# Patient Record
Sex: Female | Born: 1970 | Race: White | Hispanic: No | Marital: Married | Smoking: Never smoker
Health system: Southern US, Community
[De-identification: ages and names within clinical notes are randomized; demographics above are authoritative.]

## PROBLEM LIST (undated history)

## (undated) DIAGNOSIS — D649 Anemia, unspecified: Secondary | ICD-10-CM

## (undated) DIAGNOSIS — M1990 Unspecified osteoarthritis, unspecified site: Secondary | ICD-10-CM

## (undated) HISTORY — PX: TONSILLECTOMY AND ADENOIDECTOMY: SHX28

## (undated) HISTORY — PX: ABDOMINAL HYSTERECTOMY: SHX81

---

## 2007-09-29 ENCOUNTER — Encounter: Payer: Self-pay | Admitting: Obstetrics & Gynecology

## 2007-11-10 ENCOUNTER — Encounter: Payer: Self-pay | Admitting: Maternal & Fetal Medicine

## 2008-01-16 ENCOUNTER — Encounter: Payer: Self-pay | Admitting: Maternal & Fetal Medicine

## 2008-03-30 ENCOUNTER — Inpatient Hospital Stay: Payer: Self-pay | Admitting: Obstetrics and Gynecology

## 2009-06-26 ENCOUNTER — Inpatient Hospital Stay: Payer: Self-pay

## 2010-03-30 HISTORY — PX: DILATION AND CURETTAGE OF UTERUS: SHX78

## 2010-05-13 ENCOUNTER — Ambulatory Visit: Payer: Self-pay | Admitting: Obstetrics and Gynecology

## 2010-05-14 ENCOUNTER — Ambulatory Visit: Payer: Self-pay | Admitting: Obstetrics and Gynecology

## 2010-05-19 LAB — PATHOLOGY REPORT

## 2016-05-25 ENCOUNTER — Other Ambulatory Visit: Payer: Self-pay | Admitting: Family Medicine

## 2016-05-25 DIAGNOSIS — Z1231 Encounter for screening mammogram for malignant neoplasm of breast: Secondary | ICD-10-CM

## 2016-06-19 ENCOUNTER — Ambulatory Visit
Admission: RE | Admit: 2016-06-19 | Discharge: 2016-06-19 | Disposition: A | Discharge status: Home / Self Care | Payer: Managed Care, Other (non HMO) | Source: Ambulatory Visit | Attending: Family Medicine | Admitting: Family Medicine

## 2016-06-19 DIAGNOSIS — Z1231 Encounter for screening mammogram for malignant neoplasm of breast: Secondary | ICD-10-CM

## 2017-02-15 ENCOUNTER — Other Ambulatory Visit: Payer: Self-pay

## 2017-02-15 ENCOUNTER — Encounter
Admission: RE | Admit: 2017-02-15 | Discharge: 2017-02-15 | Disposition: A | Discharge status: Home / Self Care | Payer: 59 | Source: Ambulatory Visit | Attending: Surgery | Admitting: Surgery

## 2017-02-15 HISTORY — DX: Anemia, unspecified: D64.9

## 2017-02-15 HISTORY — DX: Unspecified osteoarthritis, unspecified site: M19.90

## 2017-02-15 NOTE — Patient Instructions (Signed)
Your procedure is scheduled on: 02-26-17 Report to Same Day Surgery 2nd floor medical mall [facility](Medical Mall Entrance-take elevator on left to 2nd floor.  Check in with surgery information desk.) To find out your arrival time please call [phone] between 1PM - 3PM on 02-25-17  Remember: Instructions that are not followed completely may result in serious medical risk, up to and including death, or upon the discretion of your surgeon and anesthesiologist your surgery may need to be rescheduled.    _x___ 1. Do not eat food after midnight the night before your procedure. You may drink clear liquids up to 2 hours before you are scheduled to arrive at the hospital for your procedure.  Do not drink clear liquids within 2 hours of your scheduled arrival to the hospital.  Clear liquids include  --Water or Apple juice without pulp  --Clear carbohydrate beverage such as ClearFast or Gatorade  --Black Coffee or Clear Tea (No milk, no creamers, do not add anything to the coffee or Tea Type 1 and type 2 diabetics should only drink water.  No gum chewing or hard candies.     __x__ 2. No Alcohol for 24 hours before or after surgery.   __x__3. No Smoking for 24 prior to surgery.   ____  4. Bring all medications with you on the day of surgery if instructed.    __x__ 5. Notify your doctor if there is any change in your medical condition     (cold, fever, infections).     Do not wear jewelry, make-up, hairpins, clips or nail polish.  Do not wear lotions, powders, or perfumes. You may wear deodorant.  Do not shave 48 hours prior to surgery. Men may shave face and neck.  Do not bring valuables to the hospital.    [facility] is not responsible for any belongings or valuables.               Contacts, dentures or bridgework may not be worn into surgery.  Leave your suitcase in the car. After surgery it may be brought to your room.  For patients admitted to the hospital, discharge time is determined by  your                       treatment team.   Patients discharged the day of surgery will not be allowed to drive home.  You will need someone to drive you home and stay with you the night of your procedure.     ____ Take anti-hypertensive listed below, cardiac, seizure, asthma, anti-reflux and psychiatric medicines. These include:  1. NONE  2.  3.  4.  5.  6.  ____Fleets enema or Magnesium Citrate as directed.   ____ Use CHG Soap or sage wipes as directed on instruction sheet   ____ Use inhalers on the day of surgery and bring to hospital day of surgery  ____ Stop Metformin and Janumet 2 days prior to surgery.    ____ Take 1/2 of usual insulin dose the night before surgery and none on the morning     surgery.   ____ Follow recommendations from Cardiologist, Pulmonologist or PCP regarding stopping Aspirin, Coumadin, Plavix ,Eliquis, Effient, or Pradaxa, and Pletal.  ___Stop Anti-inflammatories such as Advil, Aleve, Ibuprofen, Motrin, Naproxen, Naprosyn, Goodies powders or aspirin products-STOP 48 HOURS PRIOR TO SURGERY-OK to take Tylenol    ____ Stop supplements until after surgery.     ____ Bring C-Pap to the hospital.

## 2017-02-26 ENCOUNTER — Ambulatory Visit: Payer: 59 | Admitting: Anesthesiology

## 2017-02-26 ENCOUNTER — Other Ambulatory Visit: Payer: Self-pay

## 2017-02-26 ENCOUNTER — Encounter
Admission: RE | Disposition: A | Discharge status: Home / Self Care | Payer: Self-pay | Source: Ambulatory Visit | Attending: Surgery

## 2017-02-26 ENCOUNTER — Encounter: Payer: Self-pay | Admitting: *Deleted

## 2017-02-26 ENCOUNTER — Ambulatory Visit
Admission: RE | Admit: 2017-02-26 | Discharge: 2017-02-26 | Disposition: A | Discharge status: Home / Self Care | Payer: 59 | Source: Ambulatory Visit | Attending: Surgery | Admitting: Surgery

## 2017-02-26 DIAGNOSIS — M1990 Unspecified osteoarthritis, unspecified site: Secondary | ICD-10-CM | POA: Insufficient documentation

## 2017-02-26 DIAGNOSIS — D649 Anemia, unspecified: Secondary | ICD-10-CM | POA: Insufficient documentation

## 2017-02-26 DIAGNOSIS — K429 Umbilical hernia without obstruction or gangrene: Secondary | ICD-10-CM | POA: Diagnosis present

## 2017-02-26 HISTORY — PX: UMBILICAL HERNIA REPAIR: SHX196

## 2017-02-26 SURGERY — REPAIR, HERNIA, UMBILICAL, ADULT
Anesthesia: General | Site: Abdomen | Wound class: Clean

## 2017-02-26 MED ORDER — MIDAZOLAM HCL 2 MG/2ML IJ SOLN
INTRAMUSCULAR | Status: DC | PRN
  Administered 2017-02-26: 2 mg via INTRAVENOUS

## 2017-02-26 MED ORDER — FENTANYL CITRATE (PF) 100 MCG/2ML IJ SOLN: 25.0000 ug | INTRAMUSCULAR | Status: DC | PRN

## 2017-02-26 MED ORDER — KETOROLAC TROMETHAMINE 30 MG/ML IJ SOLN
INTRAMUSCULAR | Status: DC | PRN
  Administered 2017-02-26: 30 mg via INTRAVENOUS

## 2017-02-26 MED ORDER — LIDOCAINE HCL (CARDIAC) 20 MG/ML IV SOLN
INTRAVENOUS | Status: DC | PRN
  Administered 2017-02-26: 50 mg via INTRAVENOUS

## 2017-02-26 MED ORDER — ONDANSETRON HCL 4 MG/2ML IJ SOLN
INTRAMUSCULAR | Status: AC
  Filled 2017-02-26: qty 2

## 2017-02-26 MED ORDER — GLYCOPYRROLATE 0.2 MG/ML IJ SOLN
INTRAMUSCULAR | Status: DC | PRN
  Administered 2017-02-26: 0.2 mg via INTRAVENOUS

## 2017-02-26 MED ORDER — ONDANSETRON HCL 4 MG/2ML IJ SOLN
INTRAMUSCULAR | Status: DC | PRN
  Administered 2017-02-26: 4 mg via INTRAVENOUS

## 2017-02-26 MED ORDER — PROPOFOL 10 MG/ML IV BOLUS
INTRAVENOUS | Status: DC | PRN
  Administered 2017-02-26: 100 mg via INTRAVENOUS

## 2017-02-26 MED ORDER — KETOROLAC TROMETHAMINE 30 MG/ML IJ SOLN
INTRAMUSCULAR | Status: AC
  Filled 2017-02-26: qty 1

## 2017-02-26 MED ORDER — DEXAMETHASONE SODIUM PHOSPHATE 10 MG/ML IJ SOLN
INTRAMUSCULAR | Status: AC
  Filled 2017-02-26: qty 1

## 2017-02-26 MED ORDER — BUPIVACAINE-EPINEPHRINE (PF) 0.5% -1:200000 IJ SOLN
INTRAMUSCULAR | Status: AC
  Filled 2017-02-26: qty 30

## 2017-02-26 MED ORDER — PROPOFOL 10 MG/ML IV BOLUS
INTRAVENOUS | Status: AC
  Filled 2017-02-26: qty 20

## 2017-02-26 MED ORDER — FENTANYL CITRATE (PF) 250 MCG/5ML IJ SOLN
INTRAMUSCULAR | Status: AC
  Filled 2017-02-26: qty 5

## 2017-02-26 MED ORDER — MIDAZOLAM HCL 2 MG/2ML IJ SOLN
INTRAMUSCULAR | Status: AC
  Filled 2017-02-26: qty 2

## 2017-02-26 MED ORDER — LIDOCAINE HCL (PF) 2 % IJ SOLN
INTRAMUSCULAR | Status: AC
  Filled 2017-02-26: qty 10

## 2017-02-26 MED ORDER — ONDANSETRON HCL 4 MG/2ML IJ SOLN: 4.0000 mg | Freq: Once | INTRAMUSCULAR | Status: DC | PRN

## 2017-02-26 MED ORDER — FAMOTIDINE 20 MG PO TABS: 20.0000 mg | ORAL_TABLET | Freq: Once | ORAL | Status: DC

## 2017-02-26 MED ORDER — FAMOTIDINE 20 MG PO TABS
ORAL_TABLET | ORAL | Status: AC
  Administered 2017-02-26: 20 mg
  Filled 2017-02-26: qty 1

## 2017-02-26 MED ORDER — FENTANYL CITRATE (PF) 100 MCG/2ML IJ SOLN
INTRAMUSCULAR | Status: DC | PRN
  Administered 2017-02-26: 50 ug via INTRAVENOUS
  Administered 2017-02-26 (×3): 25 ug via INTRAVENOUS

## 2017-02-26 MED ORDER — LACTATED RINGERS IV SOLN
INTRAVENOUS | Status: DC
  Administered 2017-02-26: 12:00:00 via INTRAVENOUS

## 2017-02-26 MED ORDER — DEXAMETHASONE SODIUM PHOSPHATE 10 MG/ML IJ SOLN
INTRAMUSCULAR | Status: DC | PRN
  Administered 2017-02-26: 10 mg via INTRAVENOUS

## 2017-02-26 MED ORDER — BUPIVACAINE-EPINEPHRINE (PF) 0.5% -1:200000 IJ SOLN
INTRAMUSCULAR | Status: DC | PRN
  Administered 2017-02-26: 5 mL

## 2017-02-26 SURGICAL SUPPLY — 23 items
BLADE SURG 15 STRL LF DISP TIS (BLADE) ×1 IMPLANT
BLADE SURG 15 STRL SS (BLADE) ×2
CANISTER SUCT 1200ML W/VALVE (MISCELLANEOUS) ×3 IMPLANT
CHLORAPREP W/TINT 26ML (MISCELLANEOUS) ×3 IMPLANT
DERMABOND ADVANCED (GAUZE/BANDAGES/DRESSINGS) ×2
DERMABOND ADVANCED .7 DNX12 (GAUZE/BANDAGES/DRESSINGS) ×1 IMPLANT
DRAPE LAPAROTOMY 77X122 PED (DRAPES) ×3 IMPLANT
ELECT REM PT RETURN 9FT ADLT (ELECTROSURGICAL) ×3
ELECTRODE REM PT RTRN 9FT ADLT (ELECTROSURGICAL) ×1 IMPLANT
GLOVE BIO SURGEON STRL SZ7.5 (GLOVE) ×3 IMPLANT
GOWN STRL REUS W/ TWL LRG LVL3 (GOWN DISPOSABLE) ×2 IMPLANT
GOWN STRL REUS W/TWL LRG LVL3 (GOWN DISPOSABLE) ×4
KIT RM TURNOVER STRD PROC AR (KITS) ×3 IMPLANT
LABEL OR SOLS (LABEL) IMPLANT
NEEDLE HYPO 25X1 1.5 SAFETY (NEEDLE) ×3 IMPLANT
NS IRRIG 500ML POUR BTL (IV SOLUTION) ×3 IMPLANT
PACK BASIN MINOR ARMC (MISCELLANEOUS) ×3 IMPLANT
SUT CHROMIC 3 0 SH 27 (SUTURE) ×3 IMPLANT
SUT CHROMIC 4 0 RB 1X27 (SUTURE) ×3 IMPLANT
SUT MNCRL+ 5-0 UNDYED PC-3 (SUTURE) ×1 IMPLANT
SUT MONOCRYL 5-0 (SUTURE) ×2
SUT SURGILON 0 30 BLK (SUTURE) ×6 IMPLANT
SYR 10ML LL (SYRINGE) ×3 IMPLANT

## 2017-02-26 NOTE — Discharge Instructions (Signed)
Take Tylenol as needed for pain.  May also take oxycodone if needed.  May shower.  Avoid straining and heavy lifting.  Resume methotrexate and Enbrel at appropriate time as discussed.   AMBULATORY SURGERY  DISCHARGE INSTRUCTIONS   1) The drugs that you were given will stay in your system until tomorrow so for the next 24 hours you should not:  A) Drive an automobile B) Make any legal decisions C) Drink any alcoholic beverage   2) You may resume regular meals tomorrow.  Today it is better to start with liquids and gradually work up to solid foods.  You may eat anything you prefer, but it is better to start with liquids, then soup and crackers, and gradually work up to solid foods.   3) Please notify your doctor immediately if you have any unusual bleeding, trouble breathing, redness and pain at the surgery site, drainage, fever, or pain not relieved by medication.    4) Additional Instructions: TAKE A STOOL SOFTENER TWICE A DAY WHILE TAKING NARCOTIC PAIN MEDICINE TO PREVENT CONSTIPATION   Please contact your physician with any problems or Same Day Surgery at [phone], Monday through Friday 6 am to 4 pm, or [place] at [facility] Main number at [phone].

## 2017-02-26 NOTE — Anesthesia Procedure Notes (Signed)
Procedure Name: LMA Insertion Date/Time: 02/26/2017 12:58 PM Performed by: Dava NajjarFrazier, Delesia Martinek, CRNA Pre-anesthesia Checklist: Patient identified, Emergency Drugs available, Suction available, Patient being monitored and Timeout performed Patient Re-evaluated:Patient Re-evaluated prior to induction Oxygen Delivery Method: Circle system utilized Preoxygenation: Pre-oxygenation with 100% oxygen Induction Type: IV induction LMA: LMA inserted LMA Size: 3.5 Number of attempts: 1 Placement Confirmation: positive ETCO2 and breath sounds checked- equal and bilateral Tube secured with: Tape Dental Injury: Teeth and Oropharynx as per pre-operative assessment

## 2017-02-26 NOTE — Consult Note (Signed)
  She comes in today prepared for umbilical hernia repair.  She reports no change in overall condition since the recent office visit.  Lab work done on 02/05/2017 reviewed.  I discussed the plan for surgery and postoperative care.

## 2017-02-26 NOTE — Anesthesia Post-op Follow-up Note (Signed)
Anesthesia QCDR form completed.        

## 2017-02-26 NOTE — Anesthesia Postprocedure Evaluation (Signed)
Anesthesia Post Note  Patient: Shelly Moore  Procedure(s) Performed: HERNIA REPAIR UMBILICAL ADULT (N/A Abdomen)  Patient location during evaluation: PACU Anesthesia Type: General Level of consciousness: awake and alert and oriented Pain management: pain level controlled Vital Signs Assessment: post-procedure vital signs reviewed and stable Respiratory status: spontaneous breathing, nonlabored ventilation and respiratory function stable Cardiovascular status: blood pressure returned to baseline and stable Postop Assessment: no signs of nausea or vomiting Anesthetic complications: no     Last Vitals:  Vitals:   02/26/17 1449 02/26/17 1456  BP: 107/74   Pulse: 94   Resp: 12   Temp:  36.6 C  SpO2: 100%     Last Pain:  Vitals:   02/26/17 1456  TempSrc: Temporal  PainSc:                  Malakhi Markwood

## 2017-02-26 NOTE — Transfer of Care (Signed)
Immediate Anesthesia Transfer of Care Note  Patient: Shelly Moore  Procedure(s) Performed: HERNIA REPAIR UMBILICAL ADULT (N/A Abdomen)  Patient Location: PACU  Anesthesia Type:General  Level of Consciousness: awake, alert  and patient cooperative  Airway & Oxygen Therapy: Patient Spontanous Breathing and Patient connected to nasal cannula oxygen  Post-op Assessment: Report given to RN and Post -op Vital signs reviewed and stable  Post vital signs: Reviewed and stable  Last Vitals:  Vitals:   02/26/17 1159 02/26/17 1402  BP: 112/75   Pulse: 89   Resp: 16   Temp: 36.8 C 36.6 C  SpO2: 100%     Last Pain:  Vitals:   02/26/17 1159  TempSrc: Tympanic         Complications: No apparent anesthesia complications

## 2017-02-26 NOTE — Anesthesia Preprocedure Evaluation (Signed)
Anesthesia Evaluation  Patient identified by MRN, date of birth, ID band Patient awake    Reviewed: Allergy & Precautions, NPO status , Patient's Chart, lab work & pertinent test results, reviewed documented beta blocker date and time   Airway Mallampati: II  TM Distance: >3 FB     Dental  (+) Chipped   Pulmonary           Cardiovascular      Neuro/Psych    GI/Hepatic   Endo/Other    Renal/GU      Musculoskeletal  (+) Arthritis , Rheumatoid disorders,    Abdominal   Peds  Hematology  (+) anemia ,   Anesthesia Other Findings   Reproductive/Obstetrics                             Anesthesia Physical Anesthesia Plan  ASA: III  Anesthesia Plan: General   Post-op Pain Management:    Induction: Intravenous  PONV Risk Score and Plan:   Airway Management Planned: LMA  Additional Equipment:   Intra-op Plan:   Post-operative Plan:   Informed Consent: I have reviewed the patients History and Physical, chart, labs and discussed the procedure including the risks, benefits and alternatives for the proposed anesthesia with the patient or authorized representative who has indicated his/her understanding and acceptance.     Plan Discussed with: CRNA  Anesthesia Plan Comments:         Anesthesia Quick Evaluation

## 2017-02-26 NOTE — Op Note (Signed)
OPERATIVE REPORT  PREOPERATIVE  DIAGNOSIS: Umbilical hernia  POSTOPERATIVE DIAGNOSIS: Umbilical hernia  PROCEDURE: Umbilical hernia repair  ANESTHESIA: General  SURGEON: Renda RollsWilton Smith M.D.  INDICATIONS: She has had bulging at the umbilicus.  A small umbilical hernia was demonstrated on the physical exam.  The patient was placed on the operating table in the supine position under general anesthesia. The abdomen was prepared with ChloraPrep and draped in a sterile manner. A transversely oriented supraumbilical curvilinear incision was made and carried down through subcutaneous tissues.  A fascial ring defect was demonstrated approximately 5 mm in dimension.  The herniated properitoneal fat was dissected away from surrounding tissues and reduced back into the abdominal cavity.   The fascial defect was closed with a transversely oriented suture line of interrupted inverted 0 Surgilon figure-of-eight sutures.  The skin of the umbilicus was sutured to the subcutaneous tissues with 5-0 Monocryl suture. The subcutaneous tissues were infiltrated with half percent Sensorcaine with epinephrine. The skin was closed with running 5-0 Monocryl subcuticular suture and Dermabond.  The patient appeared to be in satisfactory condition and was prepared for transfer to the recovery room  Renda RollsWilton Smith M.D.

## 2017-03-02 NOTE — H&P (Signed)
She reports no change in overall condition since the day of the office exam.  She comes in for umbilical hernia repair  Recent lab work noted.  I discussed the plan for umbilical hernia repair

## 2017-08-10 ENCOUNTER — Other Ambulatory Visit: Payer: Self-pay | Admitting: Family Medicine

## 2017-08-31 ENCOUNTER — Other Ambulatory Visit: Payer: Self-pay | Admitting: Family Medicine

## 2017-08-31 DIAGNOSIS — Z1231 Encounter for screening mammogram for malignant neoplasm of breast: Secondary | ICD-10-CM

## 2017-09-23 ENCOUNTER — Ambulatory Visit
Admission: RE | Admit: 2017-09-23 | Discharge: 2017-09-23 | Disposition: A | Discharge status: Home / Self Care | Payer: 59 | Source: Ambulatory Visit | Attending: Family Medicine | Admitting: Family Medicine

## 2017-09-23 DIAGNOSIS — Z1231 Encounter for screening mammogram for malignant neoplasm of breast: Secondary | ICD-10-CM

## 2017-10-04 ENCOUNTER — Other Ambulatory Visit: Payer: Self-pay | Admitting: Family Medicine

## 2017-10-04 DIAGNOSIS — R928 Other abnormal and inconclusive findings on diagnostic imaging of breast: Secondary | ICD-10-CM

## 2017-10-25 ENCOUNTER — Ambulatory Visit
Admission: RE | Admit: 2017-10-25 | Discharge: 2017-10-25 | Disposition: A | Discharge status: Home / Self Care | Payer: 59 | Source: Ambulatory Visit | Attending: Family Medicine | Admitting: Family Medicine

## 2017-10-25 DIAGNOSIS — R928 Other abnormal and inconclusive findings on diagnostic imaging of breast: Secondary | ICD-10-CM | POA: Diagnosis present

## 2018-10-26 ENCOUNTER — Other Ambulatory Visit: Payer: Self-pay | Admitting: Family Medicine

## 2018-10-26 DIAGNOSIS — Z1231 Encounter for screening mammogram for malignant neoplasm of breast: Secondary | ICD-10-CM

## 2018-11-17 ENCOUNTER — Ambulatory Visit
Admission: RE | Admit: 2018-11-17 | Discharge: 2018-11-17 | Disposition: A | Discharge status: Home / Self Care | Payer: Managed Care, Other (non HMO) | Source: Ambulatory Visit | Attending: Family Medicine | Admitting: Family Medicine

## 2018-11-17 ENCOUNTER — Other Ambulatory Visit: Payer: Self-pay

## 2018-11-17 DIAGNOSIS — Z1231 Encounter for screening mammogram for malignant neoplasm of breast: Secondary | ICD-10-CM | POA: Insufficient documentation

## 2019-01-03 ENCOUNTER — Other Ambulatory Visit: Payer: Self-pay | Admitting: Rheumatology

## 2019-01-03 DIAGNOSIS — M0579 Rheumatoid arthritis with rheumatoid factor of multiple sites without organ or systems involvement: Secondary | ICD-10-CM

## 2019-01-03 DIAGNOSIS — M79671 Pain in right foot: Secondary | ICD-10-CM

## 2019-02-02 ENCOUNTER — Ambulatory Visit
Admission: RE | Admit: 2019-02-02 | Discharge: 2019-02-02 | Disposition: A | Discharge status: Home / Self Care | Payer: No Typology Code available for payment source | Source: Ambulatory Visit | Attending: Rheumatology | Admitting: Rheumatology

## 2019-02-02 ENCOUNTER — Other Ambulatory Visit: Payer: Self-pay

## 2019-02-02 DIAGNOSIS — M0579 Rheumatoid arthritis with rheumatoid factor of multiple sites without organ or systems involvement: Secondary | ICD-10-CM

## 2019-02-02 DIAGNOSIS — M79671 Pain in right foot: Secondary | ICD-10-CM

## 2019-12-05 ENCOUNTER — Other Ambulatory Visit: Payer: Self-pay | Admitting: Family Medicine

## 2019-12-05 DIAGNOSIS — Z1231 Encounter for screening mammogram for malignant neoplasm of breast: Secondary | ICD-10-CM

## 2019-12-20 ENCOUNTER — Ambulatory Visit
Admission: RE | Admit: 2019-12-20 | Discharge: 2019-12-20 | Disposition: A | Discharge status: Home / Self Care | Payer: 59 | Source: Ambulatory Visit | Attending: Family Medicine | Admitting: Family Medicine

## 2019-12-20 ENCOUNTER — Other Ambulatory Visit: Payer: Self-pay

## 2019-12-20 DIAGNOSIS — Z1231 Encounter for screening mammogram for malignant neoplasm of breast: Secondary | ICD-10-CM | POA: Diagnosis not present

## 2019-12-27 ENCOUNTER — Other Ambulatory Visit: Payer: Self-pay | Admitting: Family Medicine

## 2019-12-27 DIAGNOSIS — R928 Other abnormal and inconclusive findings on diagnostic imaging of breast: Secondary | ICD-10-CM

## 2019-12-27 DIAGNOSIS — N6489 Other specified disorders of breast: Secondary | ICD-10-CM

## 2019-12-28 ENCOUNTER — Ambulatory Visit
Admission: RE | Admit: 2019-12-28 | Discharge: 2019-12-28 | Disposition: A | Discharge status: Home / Self Care | Payer: 59 | Source: Ambulatory Visit | Attending: Family Medicine | Admitting: Family Medicine

## 2019-12-28 ENCOUNTER — Other Ambulatory Visit: Payer: Self-pay

## 2019-12-28 DIAGNOSIS — N6489 Other specified disorders of breast: Secondary | ICD-10-CM | POA: Insufficient documentation

## 2019-12-28 DIAGNOSIS — R928 Other abnormal and inconclusive findings on diagnostic imaging of breast: Secondary | ICD-10-CM | POA: Diagnosis present

## 2019-12-29 ENCOUNTER — Other Ambulatory Visit: Payer: Self-pay | Admitting: Family Medicine

## 2019-12-29 DIAGNOSIS — N6489 Other specified disorders of breast: Secondary | ICD-10-CM

## 2020-06-28 ENCOUNTER — Ambulatory Visit
Admission: RE | Admit: 2020-06-28 | Discharge: 2020-06-28 | Disposition: A | Discharge status: Home / Self Care | Payer: 59 | Source: Ambulatory Visit | Attending: Family Medicine | Admitting: Family Medicine

## 2020-06-28 ENCOUNTER — Other Ambulatory Visit: Payer: Self-pay

## 2020-06-28 DIAGNOSIS — N6489 Other specified disorders of breast: Secondary | ICD-10-CM | POA: Insufficient documentation

## 2020-07-01 ENCOUNTER — Other Ambulatory Visit: Payer: Self-pay | Admitting: Family Medicine

## 2020-07-01 DIAGNOSIS — N6489 Other specified disorders of breast: Secondary | ICD-10-CM

## 2021-01-06 ENCOUNTER — Ambulatory Visit
Admission: RE | Admit: 2021-01-06 | Discharge: 2021-01-06 | Disposition: A | Discharge status: Home / Self Care | Payer: 59 | Source: Ambulatory Visit | Attending: Family Medicine | Admitting: Family Medicine

## 2021-01-06 ENCOUNTER — Other Ambulatory Visit: Payer: Self-pay

## 2021-01-06 DIAGNOSIS — N6489 Other specified disorders of breast: Secondary | ICD-10-CM

## 2022-09-02 ENCOUNTER — Other Ambulatory Visit: Payer: Self-pay | Admitting: Family Medicine

## 2022-09-02 DIAGNOSIS — R928 Other abnormal and inconclusive findings on diagnostic imaging of breast: Secondary | ICD-10-CM

## 2022-10-09 ENCOUNTER — Ambulatory Visit
Admission: RE | Admit: 2022-10-09 | Discharge: 2022-10-09 | Disposition: A | Discharge status: Home / Self Care | Payer: 59 | Source: Ambulatory Visit | Attending: Family Medicine | Admitting: Family Medicine

## 2022-10-09 DIAGNOSIS — R928 Other abnormal and inconclusive findings on diagnostic imaging of breast: Secondary | ICD-10-CM | POA: Diagnosis not present

## 2023-05-13 IMAGING — MG DIGITAL DIAGNOSTIC BILAT W/ TOMO W/ CAD
8 series · 9 of 24 positions shown · non-contrast
Comparison: Previous exam(s).

CLINICAL DATA: Short-term follow-up for a probably benign area of
asymmetry in the right breast. This was initially evaluated on
12/28/2019.

EXAM:
DIGITAL DIAGNOSTIC BILATERAL MAMMOGRAM WITH TOMOSYNTHESIS AND CAD
TECHNIQUE: Bilateral digital diagnostic mammography and breast tomosynthesis
was performed. The images were evaluated with computer-aided
detection.

[L MLO synth-2D]
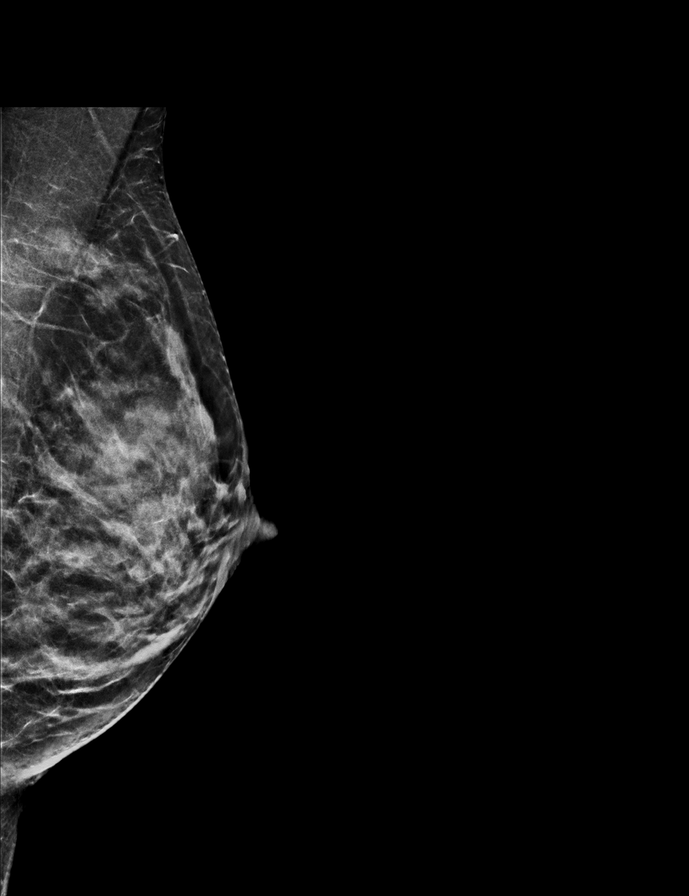

[R MLO synth-2D]
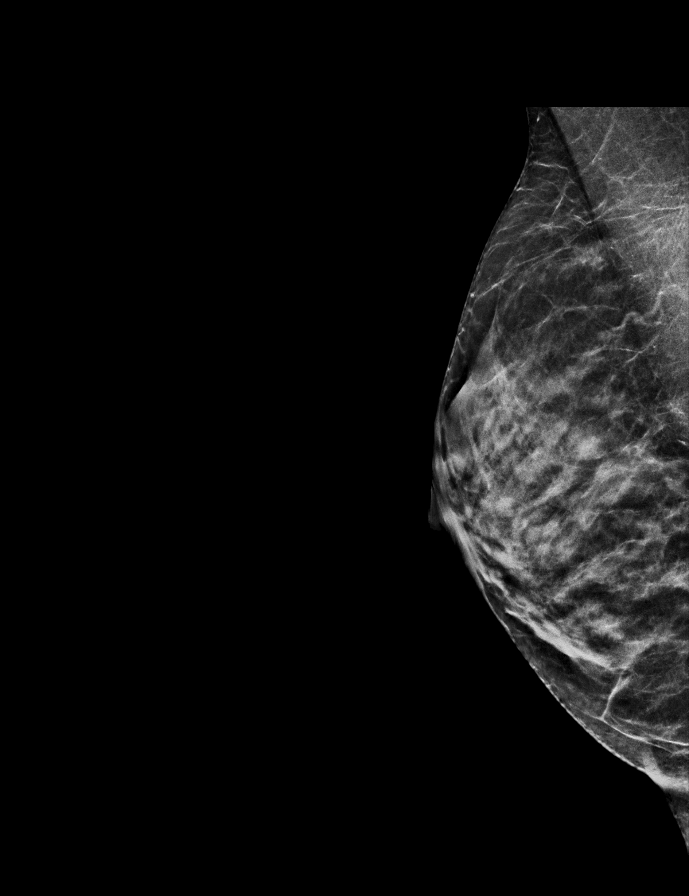

[L CC synth-2D]
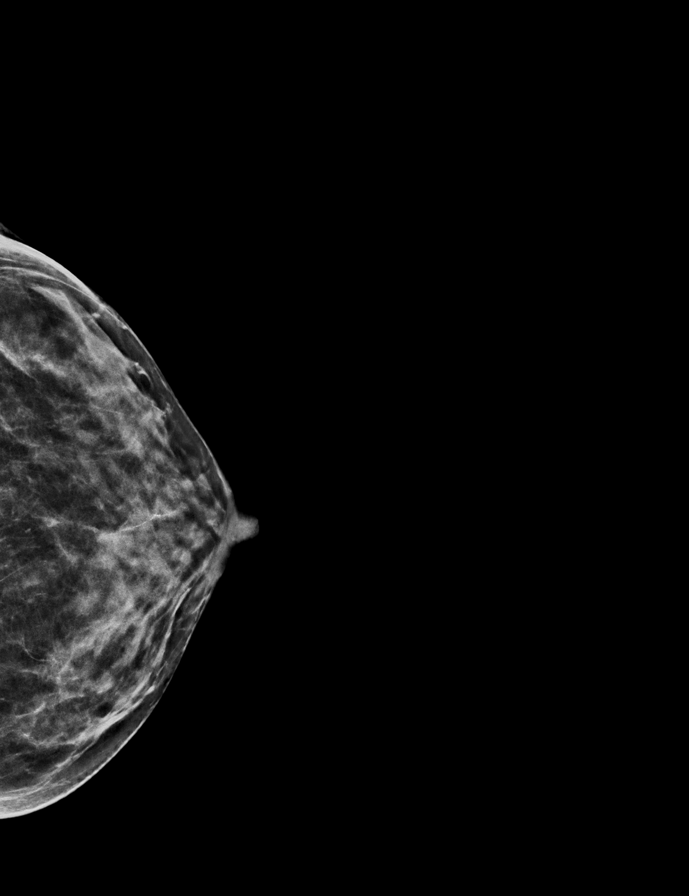

[R CC synth-2D]
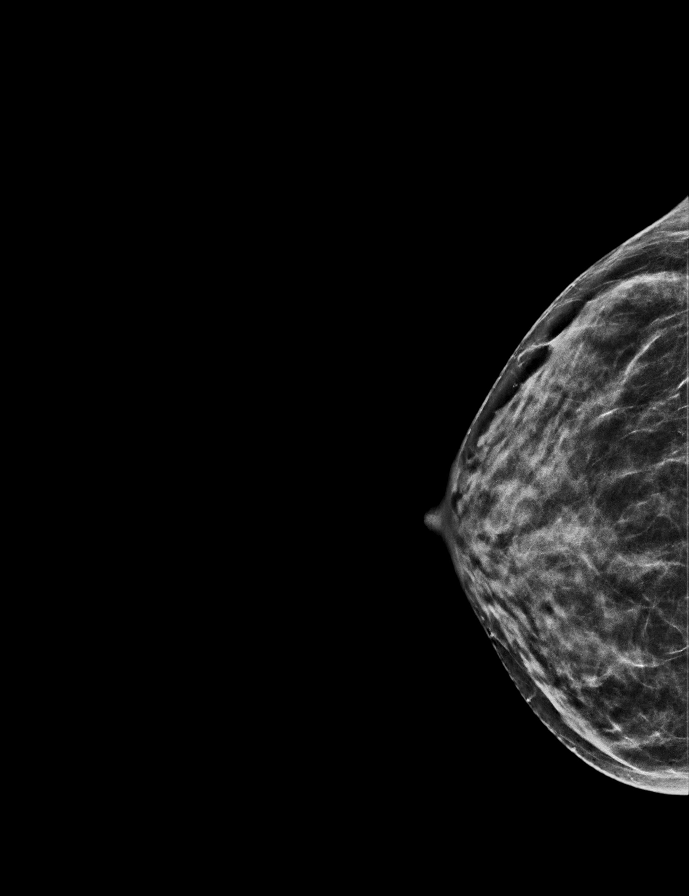

[R CC tomo · 2 of 47 frames shown]
[frame 16/47]
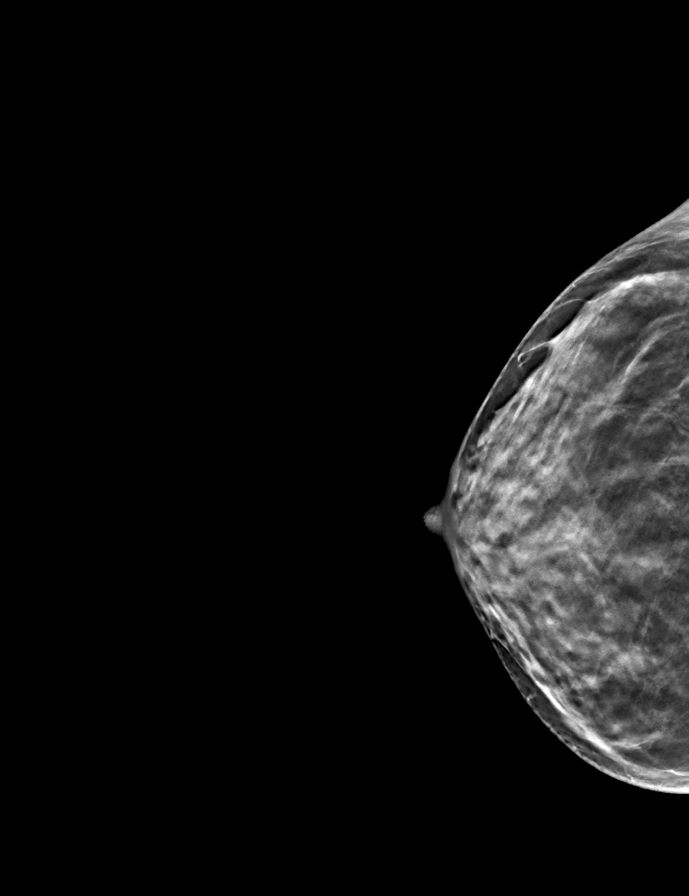
[frame 24/47]
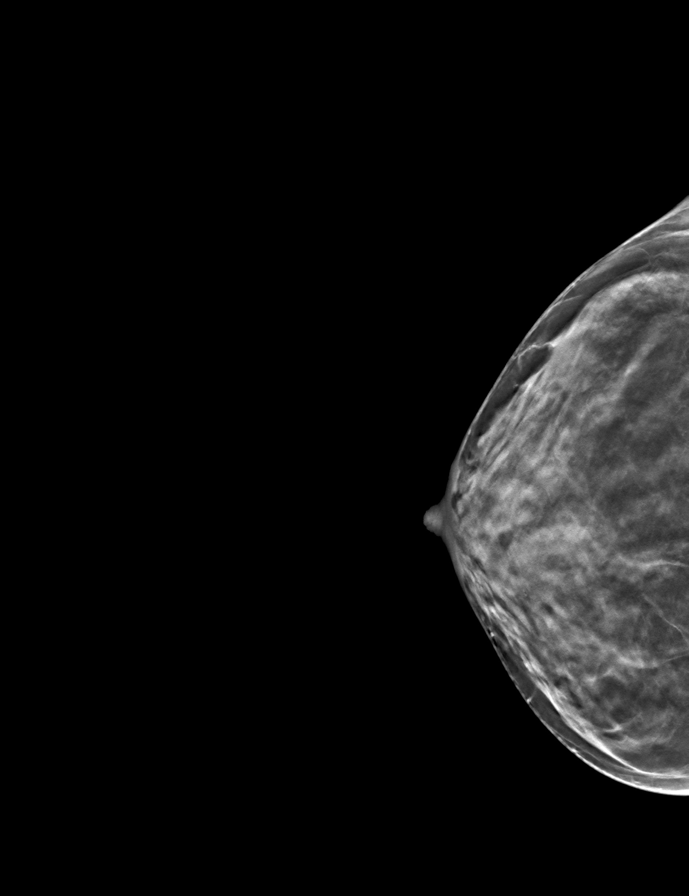

[L MLO tomo · tomo slice 25/49.0]
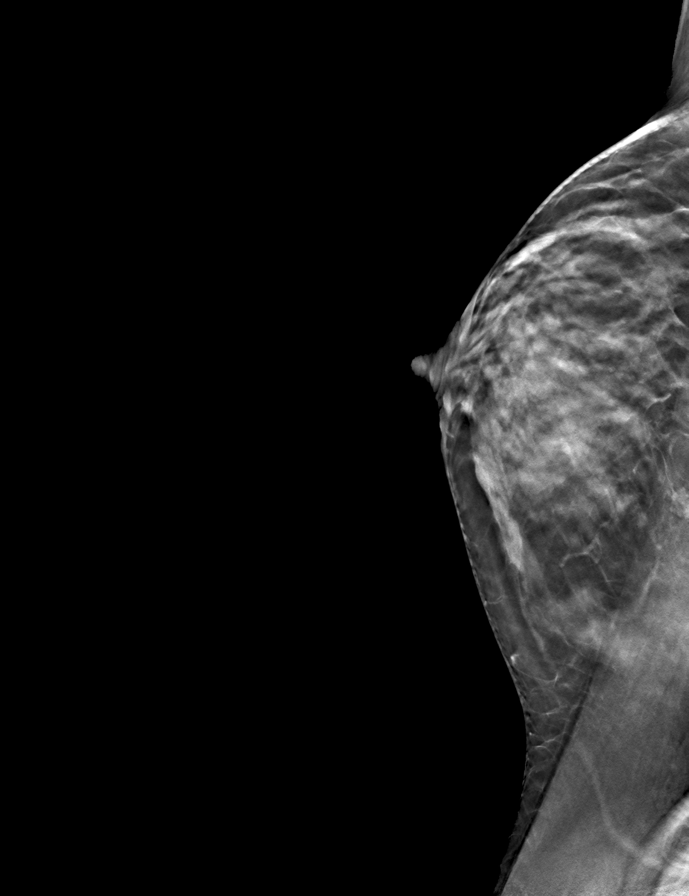

[R MLO tomo · tomo slice 23/46.0]
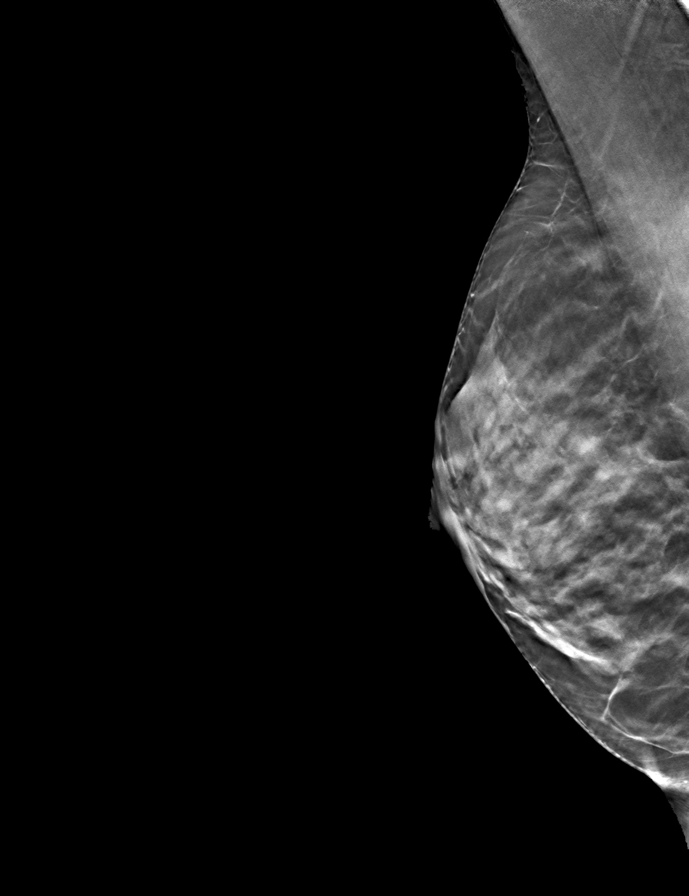

[L CC tomo · tomo slice 24/47.0]
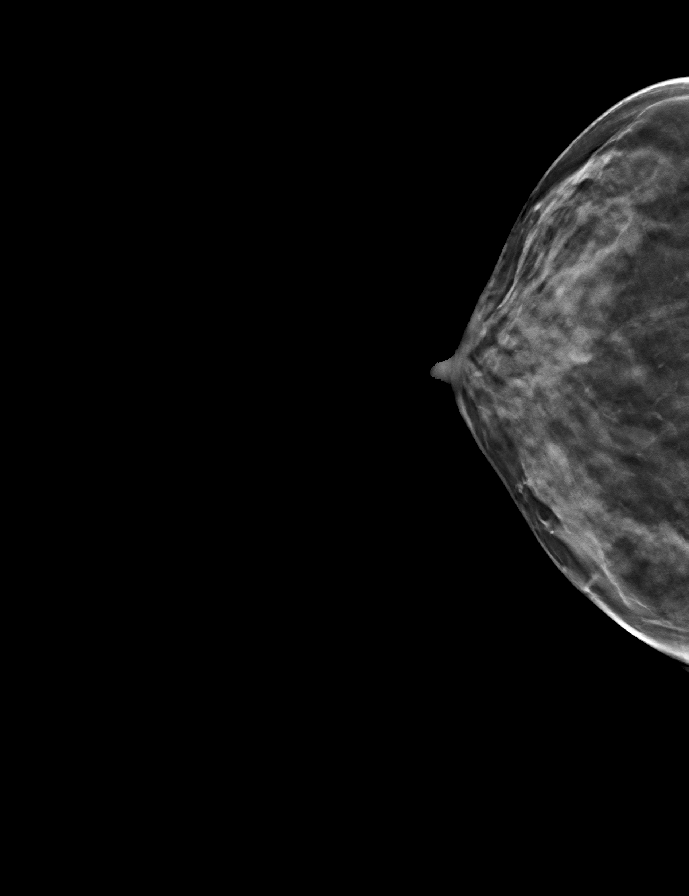

[9 of 24 positions shown; findings below may reference images not displayed]

ACR Breast Density Category c: The breast tissue is heterogeneously
dense, which may obscure small masses.
FINDINGS: The area of asymmetry in the axillary tail region of the right
breast is stable.

There are no masses, new areas of asymmetry, areas of architectural
distortion or suspicious calcifications.
IMPRESSION: 1. Probably benign asymmetry in the axillary tail region of the
right breast, stable for 1 year.

RECOMMENDATION:
1. Diagnostic mammography in 1 year to provide 2 years of stability
for the probably benign area of right breast asymmetry.

I have discussed the findings and recommendations with the patient.
If applicable, a reminder letter will be sent to the patient
regarding the next appointment.

BI-RADS CATEGORY  3: Probably benign.
